# Patient Record
Sex: Female | Born: 1985 | Race: Black or African American | Hispanic: No | Marital: Single | Smoking: Current every day smoker
Health system: Southern US, Community
[De-identification: ages and names within clinical notes are randomized; demographics above are authoritative.]

---

## 2011-06-08 ENCOUNTER — Encounter (HOSPITAL_COMMUNITY): Payer: Self-pay | Admitting: Emergency Medicine

## 2011-06-08 ENCOUNTER — Emergency Department (HOSPITAL_COMMUNITY)
Admission: EM | Admit: 2011-06-08 | Discharge: 2011-06-08 | Disposition: A | Discharge status: Home / Self Care | Payer: Self-pay | Source: Home / Self Care | Attending: Family Medicine | Admitting: Family Medicine

## 2011-06-08 ENCOUNTER — Emergency Department (INDEPENDENT_AMBULATORY_CARE_PROVIDER_SITE_OTHER): Payer: Self-pay

## 2011-06-08 DIAGNOSIS — S60229A Contusion of unspecified hand, initial encounter: Secondary | ICD-10-CM

## 2011-06-08 DIAGNOSIS — S60221A Contusion of right hand, initial encounter: Secondary | ICD-10-CM

## 2011-06-08 NOTE — ED Provider Notes (Signed)
History     CSN: [id number]  Arrival date & time 06/08/11  1345   First MD Initiated Contact with Patient 06/08/11 1518      Chief Complaint  Patient presents with  . Hand Pain    (Consider location/radiation/quality/duration/timing/severity/associated sxs/prior treatment) Patient is a 26 y.o. female presenting with hand pain. The history is provided by the patient.  Hand Pain This is a new problem. The current episode started yesterday (punched wall last eve, right hand still sore. was angry at time.). The problem occurs constantly. The problem has not changed since onset.   History reviewed. No pertinent past medical history.  History reviewed. No pertinent past surgical history.  No family history on file.  History  Substance Use Topics  . Smoking status: Current Everyday Smoker  . Smokeless tobacco: Not on file  . Alcohol Use: Yes    OB History    Grav Para Term Preterm Abortions TAB SAB Ect Mult Living                  Review of Systems  Constitutional: Negative.   Musculoskeletal: Positive for joint swelling.  Skin: Negative.     Allergies  Review of patient's allergies indicates no known allergies.  Home Medications  No current outpatient prescriptions on file.  BP 114/69  Pulse 60  Temp(Src) 97.2 F (36.2 C) (Oral)  Resp 16  SpO2 100%  LMP 05/25/2011  Physical Exam  Nursing note and vitals reviewed. Constitutional: She is oriented to person, place, and time. She appears well-developed and well-nourished.  Musculoskeletal: She exhibits tenderness.       Hands: Neurological: She is alert and oriented to person, place, and time.  Skin: Skin is warm and dry.    ED Course  Procedures (including critical care time)  Labs Reviewed - No data to display Dg Hand Complete Right  06/08/2011  *RADIOLOGY REPORT*  Clinical Data: Hand pain radiating to wrist  RIGHT HAND - COMPLETE 3+ VIEW  Comparison: None.  Findings: No fracture or dislocation is  seen.  The joint spaces are preserved.  The visualized soft tissues are unremarkable.  IMPRESSION: No fracture or dislocation is seen.  Original Report Authenticated By: Charline Bills, M.D.     1. Contusion of hand, right       MDM  X-rays reviewed and report per radiologist.         Linna Hoff, MD 06/08/11 [phone]

## 2011-06-08 NOTE — ED Notes (Signed)
PT HERE WITH HAND PAIN RADIATING TO WRIST AREA AFTER PUNCHING A DOOR LAST NIGHT.DIFF RO OR MAKING A FIST.C/O THROB TO ACHY PAIN.

## 2011-06-08 NOTE — Discharge Instructions (Signed)
Soak hand in warm water twice a day, advil for soreness, avoid hitting walls with your hand.

## 2011-09-20 ENCOUNTER — Emergency Department (HOSPITAL_COMMUNITY)
Admission: EM | Admit: 2011-09-20 | Discharge: 2011-09-20 | Disposition: A | Discharge status: Home / Self Care | Payer: Self-pay | Source: Home / Self Care | Attending: Family Medicine | Admitting: Family Medicine

## 2011-09-20 ENCOUNTER — Encounter (HOSPITAL_COMMUNITY): Payer: Self-pay | Admitting: Cardiology

## 2011-09-20 DIAGNOSIS — J40 Bronchitis, not specified as acute or chronic: Secondary | ICD-10-CM

## 2011-09-20 DIAGNOSIS — J329 Chronic sinusitis, unspecified: Secondary | ICD-10-CM

## 2011-09-20 MED ORDER — AMOXICILLIN 500 MG PO CAPS: 500.0000 mg | ORAL_CAPSULE | Freq: Three times a day (TID) | ORAL | Status: AC

## 2011-09-20 MED ORDER — ALBUTEROL SULFATE HFA 108 (90 BASE) MCG/ACT IN AERS: 1.0000 | INHALATION_SPRAY | Freq: Four times a day (QID) | RESPIRATORY_TRACT | Status: DC | PRN

## 2011-09-20 MED ORDER — DM-GUAIFENESIN ER 30-600 MG PO TB12: 1.0000 | ORAL_TABLET | Freq: Two times a day (BID) | ORAL | Status: AC

## 2011-09-20 MED ORDER — IBUPROFEN 600 MG PO TABS: 600.0000 mg | ORAL_TABLET | Freq: Three times a day (TID) | ORAL | Status: AC

## 2011-09-20 MED ORDER — PREDNISONE 20 MG PO TABS: ORAL_TABLET | ORAL | Status: AC

## 2011-09-20 NOTE — ED Provider Notes (Signed)
History     CSN: [id number]  Arrival date & time 09/20/11  1609   First MD Initiated Contact with Patient 09/20/11 1609      Chief Complaint  Patient presents with  . Cough  . Nasal Congestion    (Consider location/radiation/quality/duration/timing/severity/associated sxs/prior treatment) HPI Comments: 26 year old smoker female with no significant past medical history. Here complaining of persistent cough with clear sputum, nasal congestion and sinus pressure for one week. Decreased appetite but drinking fluids well. Feels chest pressure during coughing spells. 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 Has felt warm and sweaty (denies rigors) but has not checked her temperature. Denies nausea vomiting and diarrhea. Reports general malaise and headache. Symptoms worsening.   History reviewed. No pertinent past medical history.  History reviewed. No pertinent past surgical history.  No family history on file.  History  Substance Use Topics  . Smoking status: Current Everyday Smoker -- 1.0 packs/day  . Smokeless tobacco: Not on file  . Alcohol Use: Yes     occas    OB History    Grav Para Term Preterm Abortions TAB SAB Ect Mult Living                  Review of Systems  Constitutional: Positive for chills and appetite change.  HENT: Positive for congestion, sore throat, rhinorrhea and sinus pressure. Negative for trouble swallowing, neck pain and neck stiffness.   Eyes: Negative for discharge.  Respiratory: Positive for cough.   Gastrointestinal: Negative for nausea, vomiting, abdominal pain and diarrhea.  Musculoskeletal: Positive for myalgias.  Skin: Negative for rash.  Neurological: Positive for headaches. Negative for dizziness.    Allergies  Review of patient's allergies indicates no known allergies.  Home Medications   Current Outpatient Rx  Name Route Sig  Dispense Refill  . ALBUTEROL SULFATE HFA 108 (90 BASE) MCG/ACT IN AERS Inhalation Inhale 1-2 puffs into the lungs every 6 (six) hours as needed for wheezing. 1 Inhaler 0  . AMOXICILLIN 500 MG PO CAPS Oral Take 1 capsule (500 mg total) by mouth 3 (three) times daily. 21 capsule 0  . DM-GUAIFENESIN ER 30-600 MG PO TB12 Oral Take 1 tablet by mouth every 12 (twelve) hours. 10 tablet 0  . IBUPROFEN 600 MG PO TABS Oral Take 1 tablet (600 mg total) by mouth 3 (three) times daily. 20 tablet 0  . PREDNISONE 20 MG PO TABS  2 tabs by mouth daily for 5 days 10 tablet 0    BP 115/75  Pulse 89  Temp 98.7 F (37.1 C) (Oral)  Resp 16  SpO2 98%  LMP 09/06/2011  Physical Exam  Nursing note and vitals reviewed. Constitutional: She is oriented to person, place, and time. She appears well-developed and well-nourished. No distress.  HENT:  Head: Normocephalic and atraumatic.  Mouth/Throat: No oropharyngeal exudate.       Nasal Congestion with erythema and swelling of nasal turbinates, clear rhinorrhea. Reported maxillary sinus tenderness bilateral.  pharyngeal erythema no exudates. No uvula deviation. No trismus. TM's with increased vascular markings and some dullness bilaterally no swelling or bulging   Eyes: Conjunctivae and EOM are normal. Pupils are equal, round, and reactive to light. Right eye exhibits no discharge. Left eye exhibits no discharge. No scleral icterus.  Neck: Normal range of motion. Neck supple. No JVD present.  Cardiovascular: Normal heart sounds.   Pulmonary/Chest: She exhibits  no tenderness.       Impress subttle decreased in breath sounds on right lower lung. Scattered rhonchi bilaterally. No wheezing. No rales. No tachypnea or orthopnea.  Lymphadenopathy:    She has no cervical adenopathy.  Neurological: She is alert and oriented to person, place, and time.  Skin: No rash noted.    ED Course  Procedures (including critical care time)  Labs Reviewed - No data to display No  results found.   1. Bronchitis   2. Sinusitis       MDM  Impress sinusitis and bronchitis. Likely started as a reaction to dust but symptoms persistent after 1 week and worsening. Mild decreased breath sounds on the right lung and scattered few expiratory rhonchi bilaterally without wheezing or rales. Prescribed prednisone, amoxicillin and albuterol. Asked to return if persistent or worsening symptoms despite following treatment. Patient encouraged to quit smoking.        Sharin Grave, MD 09/22/11 1800

## 2011-09-20 NOTE — ED Notes (Signed)
Pt reports nasal congestion/pressure and cough for the past week. Denies fever. Tolerating PO intake.

## 2012-01-11 ENCOUNTER — Encounter (HOSPITAL_COMMUNITY): Payer: Self-pay | Admitting: *Deleted

## 2012-01-11 ENCOUNTER — Emergency Department (HOSPITAL_COMMUNITY)
Admission: EM | Admit: 2012-01-11 | Discharge: 2012-01-11 | Disposition: A | Discharge status: Home / Self Care | Payer: Self-pay | Attending: Emergency Medicine | Admitting: Emergency Medicine

## 2012-01-11 ENCOUNTER — Emergency Department (HOSPITAL_COMMUNITY): Payer: Self-pay

## 2012-01-11 DIAGNOSIS — R630 Anorexia: Secondary | ICD-10-CM | POA: Insufficient documentation

## 2012-01-11 DIAGNOSIS — F17200 Nicotine dependence, unspecified, uncomplicated: Secondary | ICD-10-CM | POA: Insufficient documentation

## 2012-01-11 DIAGNOSIS — R1084 Generalized abdominal pain: Secondary | ICD-10-CM | POA: Insufficient documentation

## 2012-01-11 DIAGNOSIS — R509 Fever, unspecified: Secondary | ICD-10-CM | POA: Insufficient documentation

## 2012-01-11 DIAGNOSIS — R51 Headache: Secondary | ICD-10-CM | POA: Insufficient documentation

## 2012-01-11 DIAGNOSIS — R110 Nausea: Secondary | ICD-10-CM | POA: Insufficient documentation

## 2012-01-11 DIAGNOSIS — M545 Low back pain, unspecified: Secondary | ICD-10-CM | POA: Insufficient documentation

## 2012-01-11 DIAGNOSIS — R109 Unspecified abdominal pain: Secondary | ICD-10-CM

## 2012-01-11 DIAGNOSIS — R197 Diarrhea, unspecified: Secondary | ICD-10-CM | POA: Insufficient documentation

## 2012-01-11 LAB — COMPREHENSIVE METABOLIC PANEL
Albumin: 3.7 g/dL (ref 3.5–5.2)
Alkaline Phosphatase: 59 U/L (ref 39–117)
BUN: 15 mg/dL (ref 6–23)
CO2: 23 mEq/L (ref 19–32)
Chloride: 99 mEq/L (ref 96–112)
GFR calc Af Amer: >90 mL/min (ref >90)
Glucose, Bld: 82 mg/dL (ref 70–99)
Potassium: 3.8 mEq/L (ref 3.5–5.1)
Total Bilirubin: 0.2 mg/dL — ABNORMAL LOW (ref 0.3–1.2)

## 2012-01-11 LAB — CBC WITH DIFFERENTIAL/PLATELET
Basophils Absolute: 0 10*3/uL (ref 0.0–0.1)
Eosinophils Relative: 1 % (ref 0–5)
Lymphocytes Relative: 25 % (ref 12–46)
MCV: 88.7 fL (ref 78.0–100.0)
Platelets: 252 10*3/uL (ref 150–400)
RDW: 12.6 % (ref 11.5–15.5)
WBC: 6.3 10*3/uL (ref 4.0–10.5)

## 2012-01-11 LAB — URINALYSIS, ROUTINE W REFLEX MICROSCOPIC
Bilirubin Urine: NEGATIVE
Specific Gravity, Urine: 1.016 (ref 1.005–1.030)
pH: 6.5 (ref 5.0–8.0)

## 2012-01-11 LAB — URINE MICROSCOPIC-ADD ON

## 2012-01-11 LAB — POCT PREGNANCY, URINE: Preg Test, Ur: NEGATIVE

## 2012-01-11 MED ORDER — IOHEXOL 300 MG/ML  SOLN
20.0000 mL | INTRAMUSCULAR | Status: AC
  Administered 2012-01-11: 20 mL via ORAL

## 2012-01-11 MED ORDER — OXYCODONE-ACETAMINOPHEN 5-325 MG PO TABS: 1.0000 | ORAL_TABLET | ORAL | Status: AC | PRN

## 2012-01-11 MED ORDER — IBUPROFEN 800 MG PO TABS
800.0000 mg | ORAL_TABLET | Freq: Once | ORAL | Status: AC
  Administered 2012-01-11: 800 mg via ORAL
  Filled 2012-01-11: qty 1

## 2012-01-11 MED ORDER — ONDANSETRON HCL 4 MG PO TABS: 4.0000 mg | ORAL_TABLET | Freq: Four times a day (QID) | ORAL | Status: AC

## 2012-01-11 MED ORDER — IOHEXOL 300 MG/ML  SOLN
80.0000 mL | Freq: Once | INTRAMUSCULAR | Status: AC | PRN
  Administered 2012-01-11: 80 mL via INTRAVENOUS

## 2012-01-11 NOTE — ED Provider Notes (Signed)
0600 Report received from [name] PA for this 26 yo female c/o lower abdominal pain awaiting CT of the abdomen.  Patient is refusing a pelvic exam. States that her pain has resolved presently. 0800 CT of the abdomen negative for acute process.  Patient is ready for discharge and pain free.  Rx for ibuprofen, zofran and a few percocet.  Patient understands to return for worsening symptoms.   Remi Haggard, NP 01/12/12 1242

## 2012-01-11 NOTE — ED Notes (Signed)
Pt return from CT.

## 2012-01-11 NOTE — ED Provider Notes (Signed)
Medical screening examination/treatment/procedure(s) were performed by non-physician practitioner and as supervising physician I was immediately available for consultation/collaboration.    Vida Roller, MD 01/11/12 [phone]

## 2012-01-11 NOTE — ED Notes (Signed)
Pt reports intermittent abdominal pain since Friday, reports was so severe on Friday she could not stand up. Pt reports fevers, decreased appetite and nausea. Pt reports pain to lower abdomen.

## 2012-01-11 NOTE — ED Notes (Signed)
Pt not in room x 1 

## 2012-01-11 NOTE — ED Notes (Signed)
Pt states she has also had a headache all day.

## 2012-01-11 NOTE — ED Provider Notes (Signed)
History     CSN: [id number]  Arrival date & time 01/11/12  0028   First MD Initiated Contact with Patient 01/11/12 [phone]      Chief Complaint  Patient presents with  . Abdominal Pain  . Fever  . Nausea  . Headache    (Consider location/radiation/quality/duration/timing/severity/associated sxs/prior treatment) HPI History provided by pt.   Pt a poor historian.  Reports 2 months of intermittent, gassy and sharp, diffuse lower abdomen/back pain.  Sx have been worse over the past 2 days.  Pain seems to be aggravated by eating, particularly fatty foods.  Associated w/ anorexia, diarrhea and fever, max temp 101.0.  Denies N/V, hematochezia/melena, urinary and vaginal sx.  Some relief w/ gas-x. Patient's significant other reports that she has complained of abdominal pain for the past 4 years but they have always attributed it to gas.  They are concerned she could have gallstones because there is a strong FH.     History reviewed. No pertinent past medical history.  History reviewed. No pertinent past surgical history.  History reviewed. No pertinent family history.  History  Substance Use Topics  . Smoking status: Current Every Day Smoker -- 1.0 packs/day  . Smokeless tobacco: Not on file  . Alcohol Use: Yes     Comment: occas    OB History    Grav Para Term Preterm Abortions TAB SAB Ect Mult Living                  Review of Systems  All other systems reviewed and are negative.    Allergies  Review of patient's allergies indicates no known allergies.  Home Medications  No current outpatient prescriptions on file.  BP 123/74  Pulse 106  Temp 99.9 F (37.7 C) (Oral)  Resp 18  SpO2 100%  Physical Exam  Nursing note and vitals reviewed. Constitutional: She is oriented to person, place, and time. She appears well-developed and well-nourished. No distress.  HENT:  Head: Normocephalic and atraumatic.  Eyes:       Normal appearance  Neck: Normal range of motion.    Cardiovascular: Normal rate and regular rhythm.   Pulmonary/Chest: Effort normal and breath sounds normal. No respiratory distress.  Abdominal: Soft. Bowel sounds are normal. She exhibits no distension and no mass. There is no tenderness. There is no rebound and no guarding.  Genitourinary:       Mild L CVA tenderness  Musculoskeletal: Normal range of motion.  Neurological: She is alert and oriented to person, place, and time.  Skin: Skin is warm and dry. No rash noted.  Psychiatric: She has a normal mood and affect. Her behavior is normal.    ED Course  Procedures (including critical care time)  Labs Reviewed  URINALYSIS, ROUTINE W REFLEX MICROSCOPIC - Abnormal; Notable for the following:    Hgb urine dipstick SMALL (*)     All other components within normal limits  COMPREHENSIVE METABOLIC PANEL - Abnormal; Notable for the following:    Sodium 134 (*)     Total Bilirubin 0.2 (*)     All other components within normal limits  CBC WITH DIFFERENTIAL - Abnormal; Notable for the following:    Monocytes Relative 22 (*)     Monocytes Absolute 1.4 (*)     All other components within normal limits  POCT PREGNANCY, URINE  URINE MICROSCOPIC-ADD ON  WET PREP, GENITAL  GC/CHLAMYDIA PROBE AMP   No results found.   No diagnosis found.  MDM  26yo healthy F presents w/ intermittent lower abdominal and lower back pain w/ associated anorexia and fever.  On exam, afebrile, well-appearing, abd benign, mild L CVA ttp.  Pt refused genitalia exam.  Labs unremarkable.  Patient concerned she may have gallstones but I have low suspicion based on characteristics of and location of pain as well as nml liver enzymes.  Will CT abd to r/o appendicitis.  Okey Dupre, NP to dispo.       Otilio Miu, PA 01/11/12 0630

## 2012-01-11 NOTE — ED Notes (Signed)
Patient transported to CT 

## 2012-01-12 LAB — URINE CULTURE: Colony Count: 9000

## 2012-01-12 NOTE — ED Provider Notes (Signed)
Medical screening examination/treatment/procedure(s) were performed by non-physician practitioner and as supervising physician I was immediately available for consultation/collaboration.    Vida Roller, MD 01/12/12 [phone]

## 2013-05-07 IMAGING — CT CT ABD-PELV W/ CM
2 of 4 series · 17 of 46 positions shown, 19 images · IV contrast (APPLIED)
Comparison: None.

CLINICAL DATA: Right lower quadrant pain and fever.  Leukocytosis.

CT ABDOMEN AND PELVIS WITH CONTRAST
TECHNIQUE: Multidetector CT imaging of the abdomen and pelvis was
performed following the standard protocol during bolus
administration of intravenous contrast.
Contrast: 80mL OMNIPAQUE IOHEXOL 300 MG/ML  SOLN

[Series 2: abd/pelv with 5.0 b31f st · axial · 0.60mm/px · z∈[-354,-10]mm · 14 of 75 slices shown, 16 images]
[im 3/75  soft-tissue]
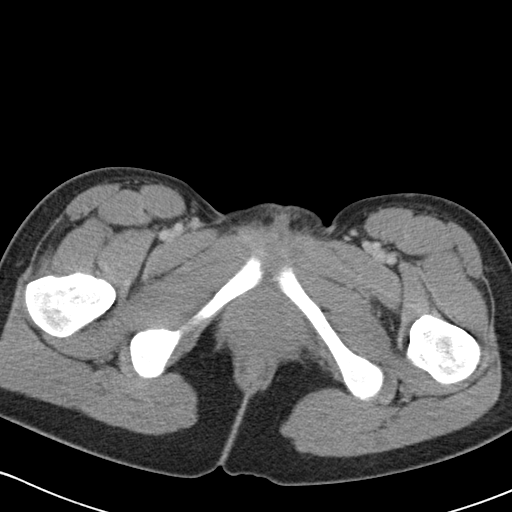
[im 3/75  bone]
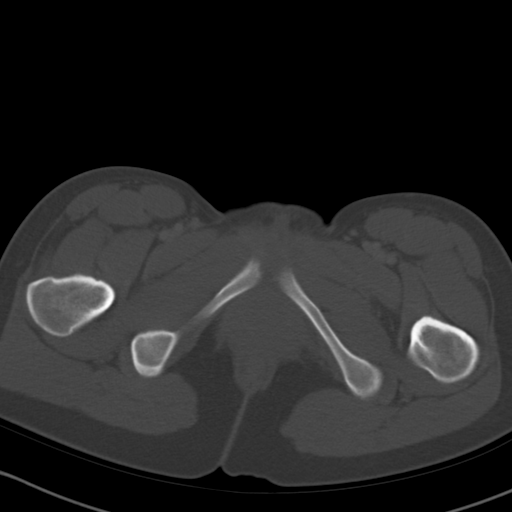
[im 9/75  soft-tissue]
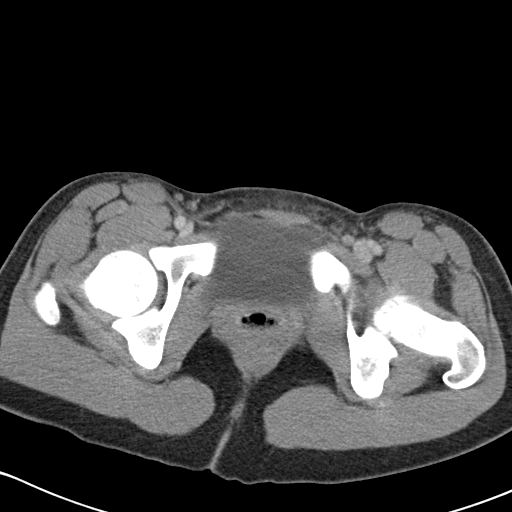
[im 15/75  soft-tissue]
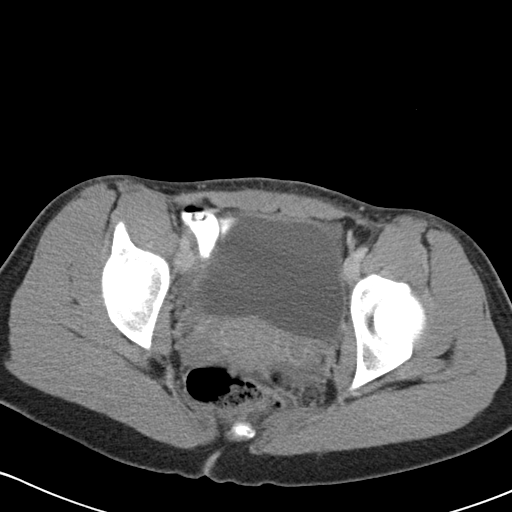
[im 21/75  soft-tissue]
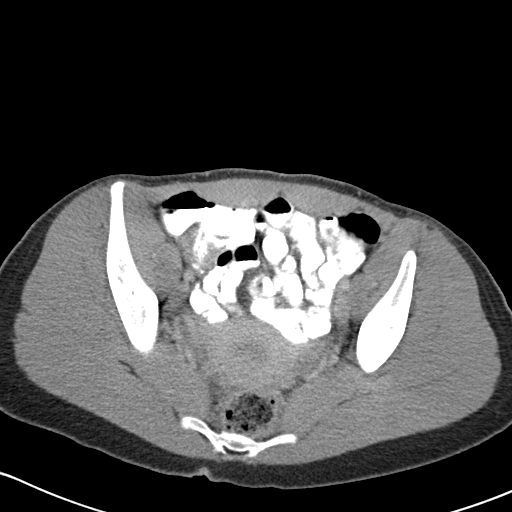
[im 24/75  soft-tissue]
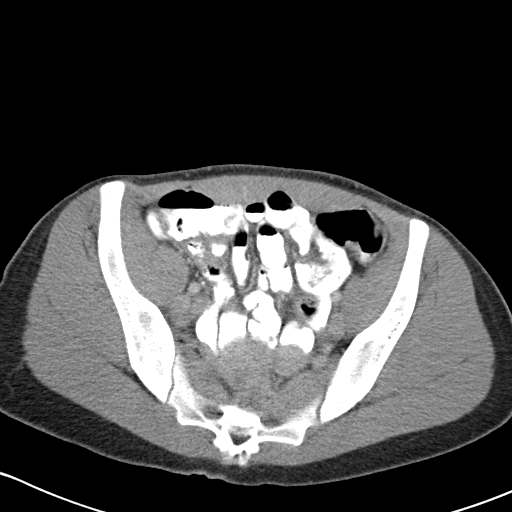
[im 30/75  soft-tissue]
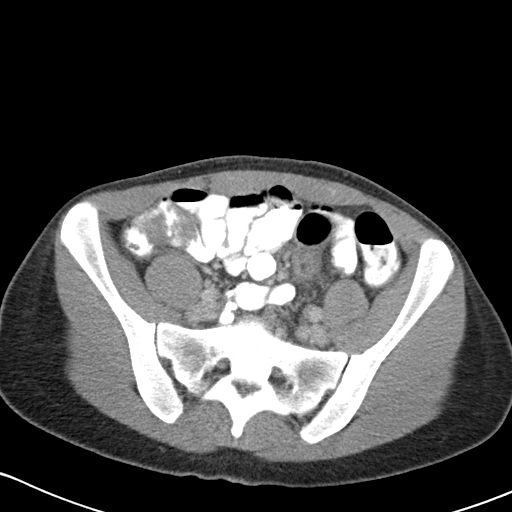
[im 36/75  soft-tissue]
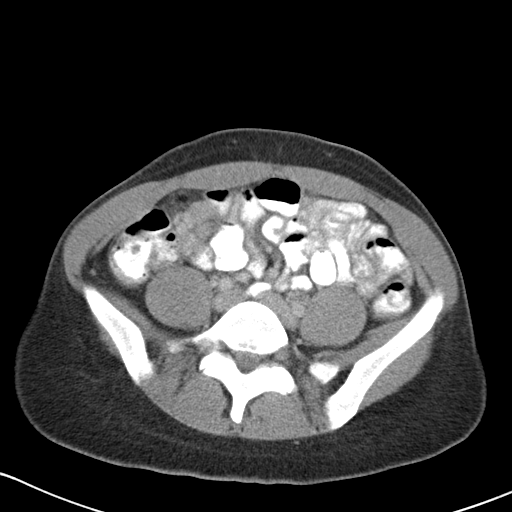
[im 39/75  soft-tissue]
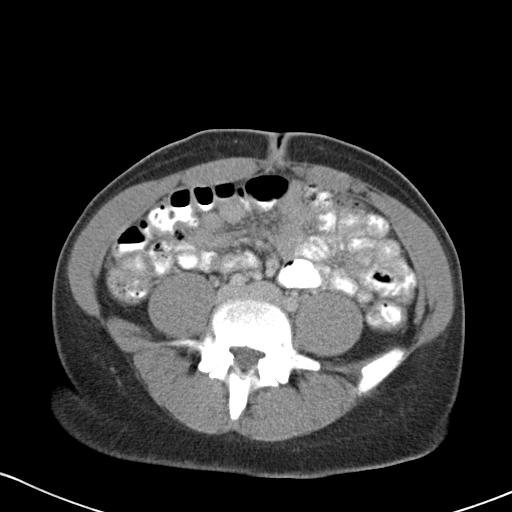
[im 45/75  soft-tissue]
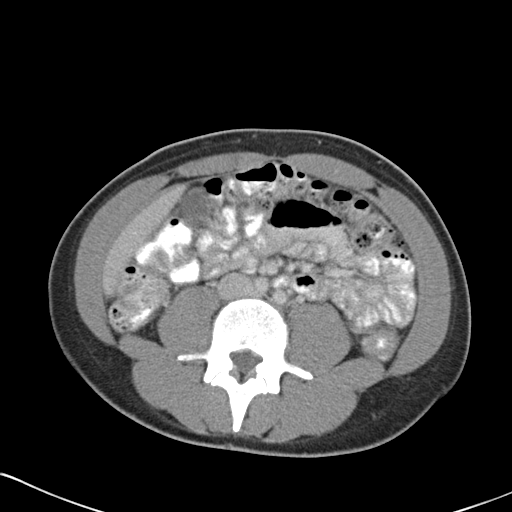
[im 45/75  bone]
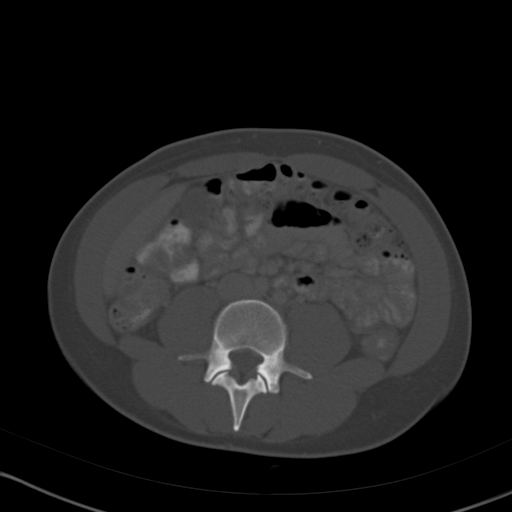
[im 51/75  soft-tissue]
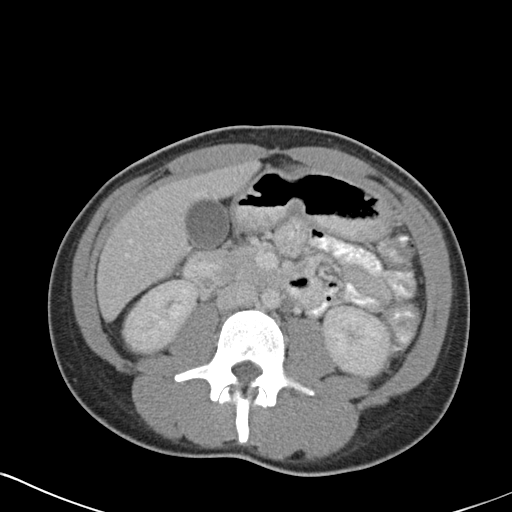
[im 57/75  soft-tissue]
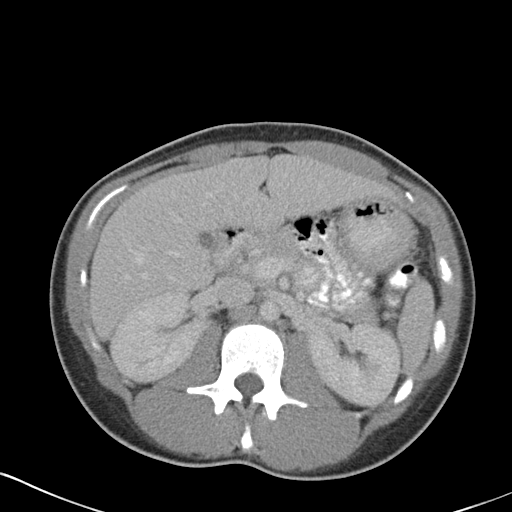
[im 60/75  soft-tissue]
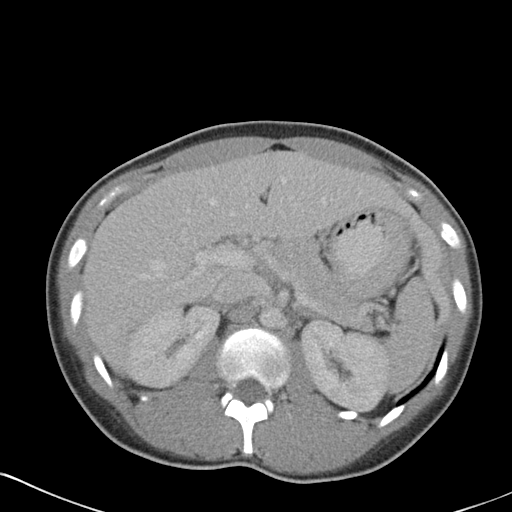
[im 66/75  soft-tissue]
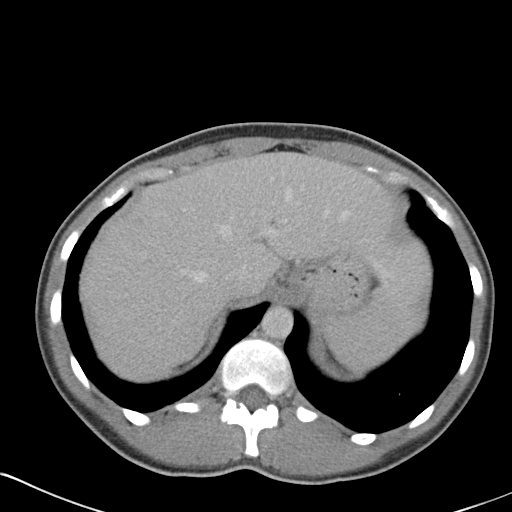
[im 72/75  soft-tissue]
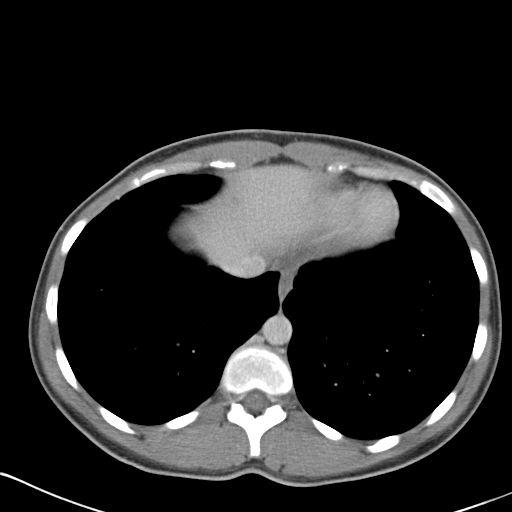

[Series 5: coronals · coronal · 0.72mm/px · 3 of 100 slices shown]
[im 34/100  soft-tissue]
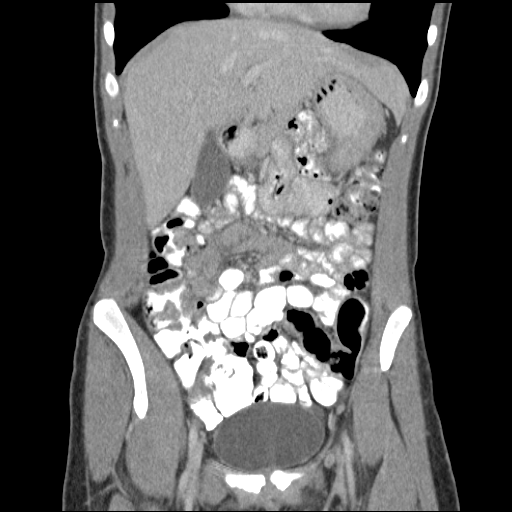
[im 45/100  soft-tissue]
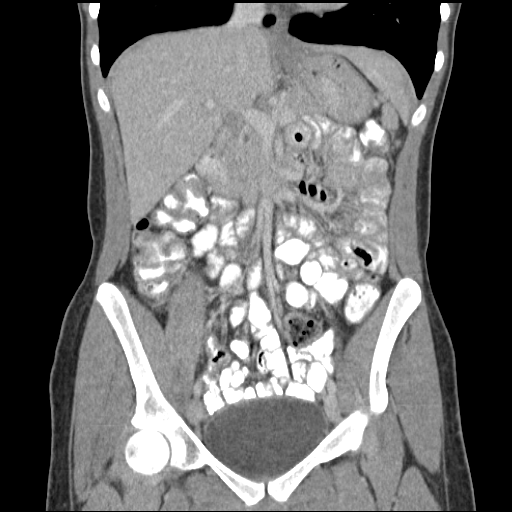
[im 56/100  soft-tissue]
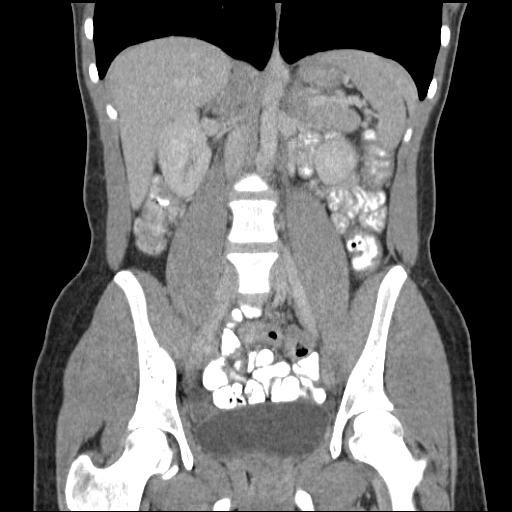

[17 of 46 positions shown; findings below may reference images not displayed]

FINDINGS: Lung bases show no acute findings.  Heart size normal.
No pericardial or pleural effusion.

Sub centimeter low attenuation lesion in the peripheral right
hepatic lobe is too small to characterize.  Liver, gallbladder,
adrenal gland, kidneys, spleen, pancreas, stomach and bowel are
unremarkable.  Although the appendix is not confidently identified,
there are no inflammatory changes nor adenopathy in the right lower
quadrant.

Small pelvic free fluid.  Uterus is visualized.  No pathologically
enlarged lymph nodes.  No worrisome lytic or sclerotic lesions.
IMPRESSION: 1.  The appendix is not readily identified.  No inflammatory
changes or adenopathy in the right lower quadrant to suggest
appendicitis.
2.  Small pelvic free fluid, possibly physiologic.

## 2016-09-19 ENCOUNTER — Emergency Department (HOSPITAL_COMMUNITY)
Admission: EM | Admit: 2016-09-19 | Discharge: 2016-09-19 | Disposition: A | Discharge status: Home / Self Care | Payer: Self-pay | Attending: Emergency Medicine | Admitting: Emergency Medicine

## 2016-09-19 ENCOUNTER — Encounter (HOSPITAL_COMMUNITY): Payer: Self-pay

## 2016-09-19 DIAGNOSIS — K0889 Other specified disorders of teeth and supporting structures: Secondary | ICD-10-CM | POA: Insufficient documentation

## 2016-09-19 DIAGNOSIS — Z79899 Other long term (current) drug therapy: Secondary | ICD-10-CM | POA: Insufficient documentation

## 2016-09-19 DIAGNOSIS — F17210 Nicotine dependence, cigarettes, uncomplicated: Secondary | ICD-10-CM | POA: Insufficient documentation

## 2016-09-19 DIAGNOSIS — Z7983 Long term (current) use of bisphosphonates: Secondary | ICD-10-CM | POA: Insufficient documentation

## 2016-09-19 MED ORDER — IBUPROFEN 600 MG PO TABS: 600.0000 mg | ORAL_TABLET | Freq: Four times a day (QID) | ORAL | 0 refills | Status: AC | PRN

## 2016-09-19 MED ORDER — HYDROCODONE-ACETAMINOPHEN 5-325 MG PO TABS: 1.0000 | ORAL_TABLET | Freq: Four times a day (QID) | ORAL | 0 refills | Status: AC | PRN

## 2016-09-19 MED ORDER — BUPIVACAINE-EPINEPHRINE (PF) 0.5% -1:200000 IJ SOLN
1.8000 mL | Freq: Once | INTRAMUSCULAR | Status: AC
  Administered 2016-09-19: 1.8 mL
  Filled 2016-09-19: qty 1.8

## 2016-09-19 MED ORDER — PENICILLIN V POTASSIUM 500 MG PO TABS: 500.0000 mg | ORAL_TABLET | Freq: Four times a day (QID) | ORAL | 0 refills | Status: AC

## 2016-09-19 NOTE — ED Triage Notes (Signed)
Pt reports dental pain, left lower part of mouth, onset yesterday. Pain unrelieved with OTC meds or warm salt water gargles.

## 2016-09-19 NOTE — ED Provider Notes (Signed)
[facility]-EMERGENCY DEPT Provider Note   CSN: [id number] Arrival date & time: 09/19/16  2141    History   Chief Complaint Chief Complaint  Patient presents with  . Dental Pain    HPI Tracy Ray is a 31 y.o. female.  The history is provided by the patient. No language interpreter was used.  Dental Pain   This is a new problem. The current episode started 2 days ago. The problem occurs constantly. The problem has been gradually worsening. The pain is moderate. Treatments tried: BC, goody powders, tylenol. The treatment provided no relief.    History reviewed. No pertinent past medical history.  There are no active problems to display for this patient.   History reviewed. No pertinent surgical history.  OB History    No data available       Home Medications    Prior to Admission medications   Medication Sig Start Date End Date Taking? Authorizing Provider  ondansetron (ZOFRAN) 4 MG tablet Take 1 tablet (4 mg total) by mouth every 6 (six) hours. 01/11/12   Jethro Bastosrawford, Anne W, NP  oxyCODONE-acetaminophen (PERCOCET/ROXICET) 5-325 MG per tablet Take 1 tablet by mouth every 4 (four) hours as needed for pain. 01/11/12   Jethro Bastosrawford, Anne W, NP    Family History No family history on file.  Social History Social History  Substance Use Topics  . Smoking status: Current Every Day Smoker    Packs/day: 1.00  . Smokeless tobacco: Never Used  . Alcohol use Yes     Comment: occas     Allergies   Patient has no known allergies.   Review of Systems Review of Systems Ten systems reviewed and are negative for acute change, except as noted in the HPI.    Physical Exam Updated Vital Signs BP 136/80 (BP Location: Right Arm)   Pulse (!) 54   Temp 98.1 F (36.7 C) (Oral)   Resp 16   LMP 08/13/2016   SpO2 100%   Physical Exam  Constitutional: She is oriented to person, place, and time. She appears well-developed and well-nourished. No distress.  Nontoxic and in NAD.  Tearful.   HENT:  Head: Normocephalic and atraumatic.  Mouth/Throat:    No trismus or tripoding. Patient tolerating secretions without difficulty. Uvula midline. No gingival fluctuance or significant swelling. Crack to left lower 2nd molar.  Eyes: Conjunctivae and EOM are normal. No scleral icterus.  Neck: Normal range of motion.  No meningismus  Pulmonary/Chest: Effort normal. No respiratory distress. She has no wheezes.  Respirations even and unlabored  Musculoskeletal: Normal range of motion.  Neurological: She is alert and oriented to person, place, and time. She exhibits normal muscle tone. Coordination normal.  Skin: Skin is warm and dry. No rash noted. She is not diaphoretic. No erythema. No pallor.  Psychiatric: Her behavior is normal. Her mood appears anxious.  Nursing note and vitals reviewed.    ED Treatments / Results  Labs (all labs ordered are listed, but only abnormal results are displayed) Labs Reviewed - No data to display  EKG  EKG Interpretation None       Radiology No results found.  Procedures Dental Block Date/Time: 09/19/2016 11:05 PM Performed by: Antony MaduraHUMES, Dezzie Badilla Authorized by: Antony MaduraHUMES, Adaijah Endres   Consent:    Consent obtained:  Verbal and emergent situation   Consent given by:  Patient   Risks discussed:  Allergic reaction, pain, swelling and unsuccessful block   Alternatives discussed:  No treatment Indications:    Indications: dental  pain   Location:    Anesthesia block type: local at base of affected tooth. Procedure details (see MAR for exact dosages):    Needle gauge:  25 G   Anesthetic injected:  Bupivacaine 0.5% WITH epi   Injection procedure:  Anatomic landmarks identified, introduced needle and negative aspiration for blood Post-procedure details:    Outcome:  Pain relieved   Patient tolerance of procedure:  Tolerated well, no immediate complications   (including critical care time)  Medications Ordered in ED Medications    bupivacaine-epinephrine (MARCAINE W/ EPI) 0.5% -1:200000 injection 1.8 mL (not administered)     Initial Impression / Assessment and Plan / ED Course  I have reviewed the triage vital signs and the nursing notes.  Pertinent labs & imaging results that were available during my care of the patient were reviewed by me and considered in my medical decision making (see chart for details).     Patient with toothache. No gross abscess. Exam unconcerning for Ludwig's angina or spread of infection. Will treat with penicillin and pain medicine. Urged patient to follow-up with dentist. Return precautions discussed and provided. Patient discharged in stable condition with no unaddressed concerns.   Final Clinical Impressions(s) / ED Diagnoses   Final diagnoses:  Dentalgia    New Prescriptions New Prescriptions   HYDROCODONE-ACETAMINOPHEN (NORCO/VICODIN) 5-325 MG TABLET    Take 1 tablet by mouth every 6 (six) hours as needed for severe pain.   IBUPROFEN (ADVIL,MOTRIN) 600 MG TABLET    Take 1 tablet (600 mg total) by mouth every 6 (six) hours as needed.   PENICILLIN V POTASSIUM (VEETID) 500 MG TABLET    Take 1 tablet (500 mg total) by mouth 4 (four) times daily.     Antony MaduraHumes, Jaiyanna Safran, PA-C 09/19/16 2307    Tilden Fossaees, Elizabeth, MD 09/20/16 [phone]

## 2017-03-29 ENCOUNTER — Encounter (HOSPITAL_COMMUNITY): Payer: Self-pay

## 2019-12-06 ENCOUNTER — Other Ambulatory Visit: Payer: Self-pay

## 2019-12-06 DIAGNOSIS — Z20822 Contact with and (suspected) exposure to covid-19: Secondary | ICD-10-CM

## 2019-12-07 LAB — SARS-COV-2, NAA 2 DAY TAT

## 2019-12-07 LAB — NOVEL CORONAVIRUS, NAA: SARS-CoV-2, NAA: DETECTED — AB

## 2019-12-08 ENCOUNTER — Telehealth: Payer: Self-pay | Admitting: Nurse Practitioner

## 2019-12-08 NOTE — Telephone Encounter (Signed)
Called patient to discuss Covid symptoms and the use of casirivimab/imdevimab, a monoclonal antibody infusion for those with mild to moderate Covid symptoms and at a high risk of hospitalization.  Pt is qualified for this infusion at the [facility] due to; Specific high risk criteria : Other high risk medical condition per CDC:  High SVI   Message left to call back our hotline [phone].  Captola Teschner, NP   

## 2022-05-02 ENCOUNTER — Emergency Department (HOSPITAL_BASED_OUTPATIENT_CLINIC_OR_DEPARTMENT_OTHER)
Admission: EM | Admit: 2022-05-02 | Discharge: 2022-05-02 | Disposition: A | Discharge status: Home / Self Care | Payer: 59 | Attending: Emergency Medicine | Admitting: Emergency Medicine

## 2022-05-02 ENCOUNTER — Other Ambulatory Visit: Payer: Self-pay

## 2022-05-02 ENCOUNTER — Encounter (HOSPITAL_BASED_OUTPATIENT_CLINIC_OR_DEPARTMENT_OTHER): Payer: Self-pay | Admitting: Emergency Medicine

## 2022-05-02 DIAGNOSIS — X500XXA Overexertion from strenuous movement or load, initial encounter: Secondary | ICD-10-CM | POA: Diagnosis not present

## 2022-05-02 DIAGNOSIS — M79602 Pain in left arm: Secondary | ICD-10-CM | POA: Diagnosis present

## 2022-05-02 DIAGNOSIS — M5412 Radiculopathy, cervical region: Secondary | ICD-10-CM | POA: Insufficient documentation

## 2022-05-02 DIAGNOSIS — Y990 Civilian activity done for income or pay: Secondary | ICD-10-CM | POA: Insufficient documentation

## 2022-05-02 MED ORDER — PREDNISONE 20 MG PO TABS: ORAL_TABLET | ORAL | 0 refills | Status: AC

## 2022-05-02 MED ORDER — CYCLOBENZAPRINE HCL 10 MG PO TABS: 10.0000 mg | ORAL_TABLET | Freq: Two times a day (BID) | ORAL | 0 refills | Status: AC | PRN

## 2022-05-02 MED ORDER — CYCLOBENZAPRINE HCL 10 MG PO TABS: 10.0000 mg | ORAL_TABLET | Freq: Two times a day (BID) | ORAL | 0 refills | Status: DC | PRN

## 2022-05-02 MED ORDER — PREDNISONE 20 MG PO TABS: ORAL_TABLET | ORAL | 0 refills | Status: DC

## 2022-05-02 NOTE — ED Provider Notes (Signed)
[place] EMERGENCY DEPARTMENT AT [facility] [place] Provider Note   CSN: [id number] Arrival date & time: 05/02/22  [id number]     History  No chief complaint on file.   Tracy Ray is a 37 y.o. female.  The history is provided by the patient, a friend and medical records. No language interpreter was used.     37 year old female presenting with complaint of arm pain.  Patient report for the past several months she has had intermittent and recurrent pain to her arms right greater than left.  Pain symptoms started in her neck and shoulder and radiates towards her hands more noticeable at nighttime sometimes waking her up from sleep.  Sometimes she felt in the morning her hand is having trouble gripping objects due to the pain and discomfort.  She tries numerous home medication including ibuprofen, naproxen, Advil, heat, ice, without adequate relief.  The intensity of the symptoms has been waxing waning.  She does not endorse any fever, headache, chest pain or any recent trauma.  She does perform a lot of heavy lifting and repetitive work.  She is right-hand dominant.  She has never been evaluated by specialist for this before.  She denies any history of cardiac disease.    Home Medications Prior to Admission medications   Medication Sig Start Date End Date Taking? Authorizing Provider  HYDROcodone-acetaminophen (NORCO/VICODIN) 5-325 MG tablet Take 1 tablet by mouth every 6 (six) hours as needed for severe pain. 09/19/16   Antonietta Breach, PA-C  ibuprofen (ADVIL,MOTRIN) 600 MG tablet Take 1 tablet (600 mg total) by mouth every 6 (six) hours as needed. 09/19/16   Antonietta Breach, PA-C  ondansetron (ZOFRAN) 4 MG tablet Take 1 tablet (4 mg total) by mouth every 6 (six) hours. 01/11/12   Sheryle Hail, NP  oxyCODONE-acetaminophen (PERCOCET/ROXICET) 5-325 MG per tablet Take 1 tablet by mouth every 4 (four) hours as needed for pain. 01/11/12   Sheryle Hail, NP      Allergies    Patient has no  known allergies.    Review of Systems   Review of Systems  All other systems reviewed and are negative.   Physical Exam Updated Vital Signs There were no vitals taken for this visit. Physical Exam Vitals and nursing note reviewed.  Constitutional:      General: She is not in acute distress.    Appearance: She is well-developed.  HENT:     Head: Normocephalic and atraumatic.  Eyes:     Conjunctiva/sclera: Conjunctivae normal.  Cardiovascular:     Rate and Rhythm: Normal rate and regular rhythm.  Pulmonary:     Effort: Pulmonary effort is normal.  Musculoskeletal:        General: No tenderness.     Cervical back: Normal range of motion and neck supple.     Comments: 5 out of 5 strength to bilateral upper extremities with normal grip strength and intact radial pulse bilaterally.  Skin:    Capillary Refill: Capillary refill takes less than 2 seconds.     Findings: No rash.  Neurological:     Mental Status: She is alert.  Psychiatric:        Mood and Affect: Mood normal.     ED Results / Procedures / Treatments   Labs (all labs ordered are listed, but only abnormal results are displayed) Labs Reviewed - No data to display  EKG None  Radiology No results found.  Procedures Procedures    Medications Ordered in ED  Medications - No data to display  ED Course/ Medical Decision Making/ A&P                             Medical Decision Making  BP 113/74 (BP Location: Right Arm)   Pulse 96   Temp 98.4 F (36.9 C) (Oral)   Resp 16   Ht '5\' 7"'$  (1.702 m)   Wt 61.2 kg   LMP 05/02/2022 (Exact Date)   SpO2 100%   BMI 21.14 kg/m   66:71 AM 37 year old female presenting with complaint of arm pain.  Patient report for the past several months she has had intermittent and recurrent pain to her arms right greater than left.  Pain symptoms started in her neck and shoulder and radiates towards her hands more noticeable at nighttime sometimes waking her up from sleep.   Sometimes she felt in the morning her hand is having trouble gripping objects due to the pain and discomfort.  She tries numerous home medication including ibuprofen, naproxen, Advil, heat, ice, without adequate relief.  The intensity of the symptoms has been waxing waning.  She does not endorse any fever, headache, chest pain or any recent trauma.  She does perform a lot of heavy lifting and repetitive work.  She is right-hand dominant.  She has never been evaluated by specialist for this before.  She denies any history of cardiac disease.   On exam, patient is resting comfortably appears to be in no acute discomfort.  Neck with full range of motion, negative Spurling sign.  Patient has 5 out of 5 strength to bilateral upper extremities with equal strength and intact distal radial pulses with brisk cap refill.  Sensation is intact throughout bilateral arms.  Heart with normal rate and rhythm, lungs are clear to auscultation bilaterally  DDx: Cervical radiculopathy, rotator cuff impingement, carpal tunnel syndrome, strain, sprain, ACS, MS  With the chronicity of her symptoms and no appreciable objective finding, I suspect her pain is likely due to cervical radiculopathy.  I have low suspicion for infection, or any acute emergent medical condition.  Low suspicion for MS.  Doubt fracture or dislocation.  Plan to prescribe prednisone, muscle accident and have patient follow-up with orthopedist for outpatient evaluation and likely physical therapy as appropriate.  Return precaution given.  Social determinant of health including tobacco use, recommend cessation.  Imaging including cervical spine MRI, on a CTA considered but not performed due to low suspicion for acute emergent condition.        Final Clinical Impression(s) / ED Diagnoses Final diagnoses:  Cervical radiculopathy    Rx / DC Orders ED Discharge Orders          Ordered    predniSONE (DELTASONE) 20 MG tablet        05/02/22 1102     cyclobenzaprine (FLEXERIL) 10 MG tablet  2 times daily PRN        05/02/22 1102              Domenic Moras, PA-C 05/02/22 1103    Gareth Morgan, MD 05/02/22 2136

## 2022-05-02 NOTE — ED Notes (Signed)
Discharge paperwork reviewed entirely with patient, including Rx's and follow up care. Pain was under control. Pt verbalized understanding as well as all parties involved. No questions or concerns voiced at the time of discharge. No acute distress noted.   Pt ambulated out to [facility] without incident or assistance.  

## 2022-05-02 NOTE — ED Triage Notes (Signed)
Patient reports neck, back and arm pain for couple of months. Patient does heavy lifting at work but that is not new.
